# Patient Record
Sex: Female | Born: 1965 | Race: Black or African American | Hispanic: No | Marital: Single | Smoking: Never smoker
Health system: Southern US, Community
[De-identification: ages and names within clinical notes are randomized; demographics above are authoritative.]

## PROBLEM LIST (undated history)

## (undated) HISTORY — PX: CHOLECYSTECTOMY: SHX55

---

## 1998-07-11 ENCOUNTER — Other Ambulatory Visit: Admission: RE | Admit: 1998-07-11 | Discharge: 1998-07-11 | Payer: Self-pay | Admitting: Obstetrics and Gynecology

## 1998-11-16 ENCOUNTER — Inpatient Hospital Stay (HOSPITAL_COMMUNITY): Admission: AD | Admit: 1998-11-16 | Discharge: 1998-11-16 | Payer: Self-pay | Admitting: Obstetrics and Gynecology

## 1999-02-26 ENCOUNTER — Inpatient Hospital Stay (HOSPITAL_COMMUNITY): Admission: AD | Admit: 1999-02-26 | Discharge: 1999-02-28 | Payer: Self-pay | Admitting: Podiatrist

## 1999-03-29 ENCOUNTER — Other Ambulatory Visit: Admission: RE | Admit: 1999-03-29 | Discharge: 1999-03-29 | Payer: Self-pay | Admitting: Obstetrics and Gynecology

## 2003-05-28 ENCOUNTER — Emergency Department (HOSPITAL_COMMUNITY): Admission: EM | Admit: 2003-05-28 | Discharge: 2003-05-28 | Payer: Self-pay | Admitting: Emergency Medicine

## 2006-12-23 ENCOUNTER — Emergency Department (HOSPITAL_COMMUNITY): Admission: EM | Admit: 2006-12-23 | Discharge: 2006-12-23 | Payer: Self-pay | Admitting: Emergency Medicine

## 2007-01-22 ENCOUNTER — Ambulatory Visit (HOSPITAL_COMMUNITY): Admission: RE | Admit: 2007-01-22 | Discharge: 2007-01-23 | Payer: Self-pay | Admitting: General Surgery

## 2007-01-22 ENCOUNTER — Encounter (INDEPENDENT_AMBULATORY_CARE_PROVIDER_SITE_OTHER): Payer: Self-pay | Admitting: General Surgery

## 2008-05-12 IMAGING — US US ABDOMEN COMPLETE
1 series · 14 of 25 positions shown · non-contrast
Comparison: None.

CLINICAL DATA: Epigastric/chest pain with nausea and vomiting.
 ABDOMEN ULTRASOUND COMPLETE ? 12/23/06:
TECHNIQUE: Complete abdominal ultrasound examination was performed including evaluation of the liver, gallbladder, bile ducts, pancreas, kidneys, spleen, IVC, and abdominal aorta.

[Series 1: unknown · 0.33mm/px · 14 of 64 slices shown]
[im 1/64]
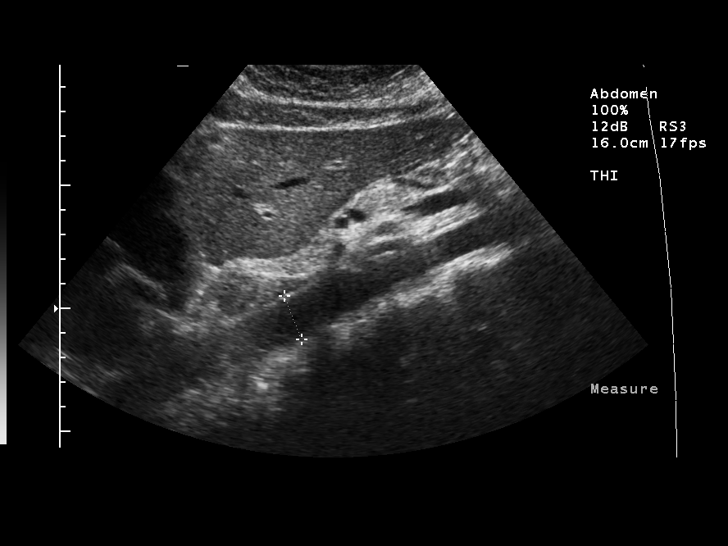
[im 6/64]
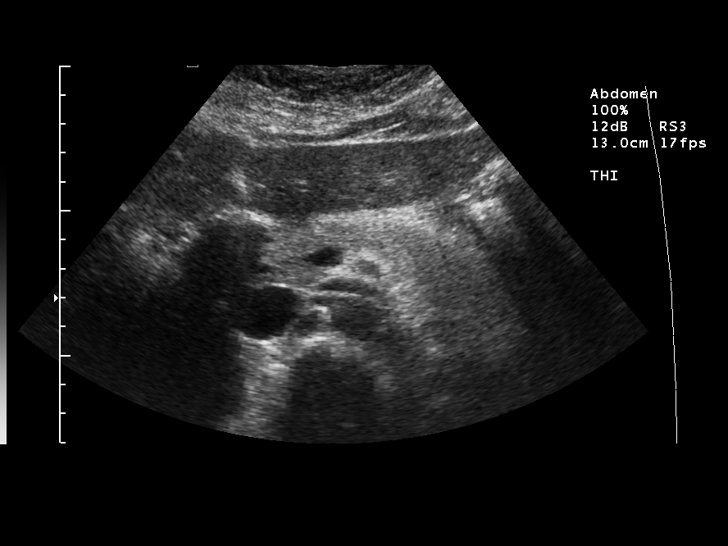
[im 11/64]
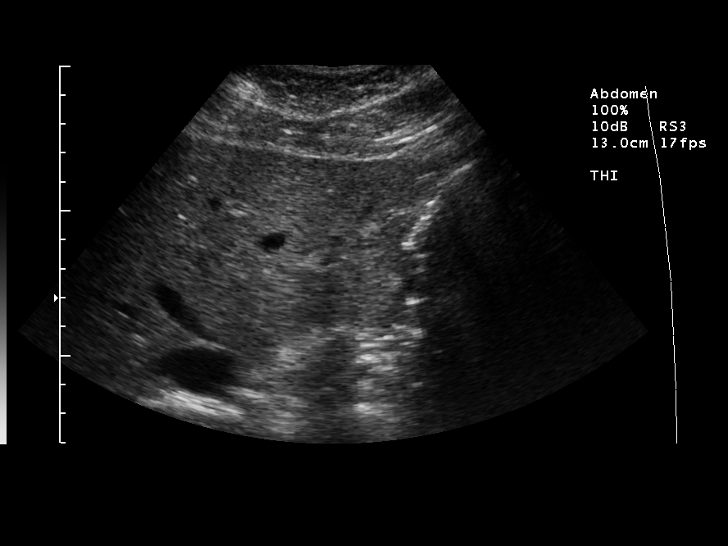
[im 16/64]
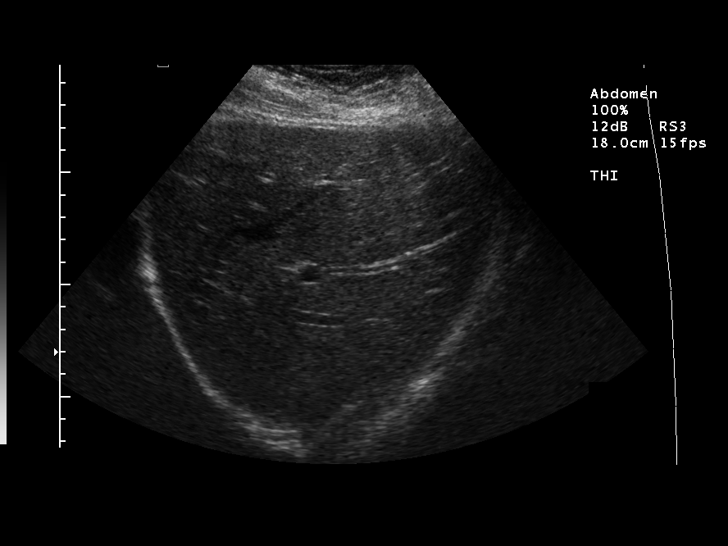
[im 22/64]
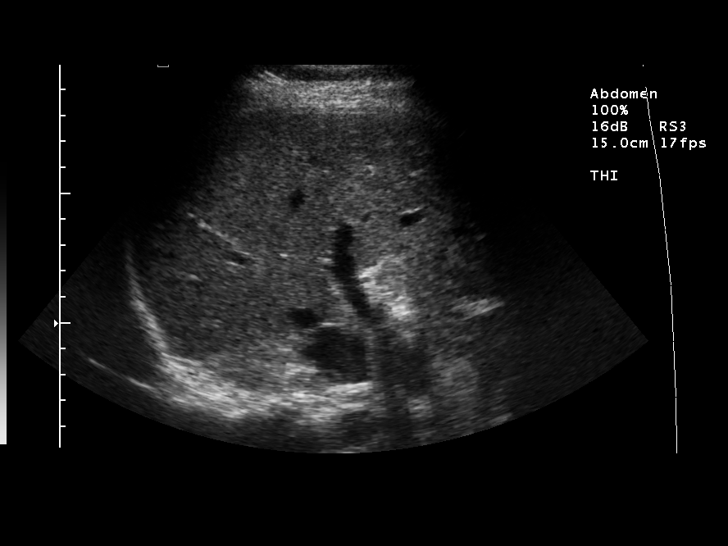
[im 24/64]
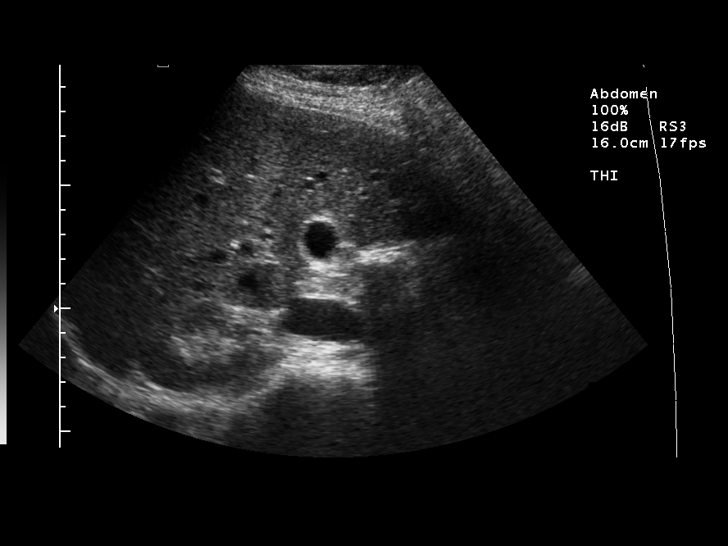
[im 29/64]
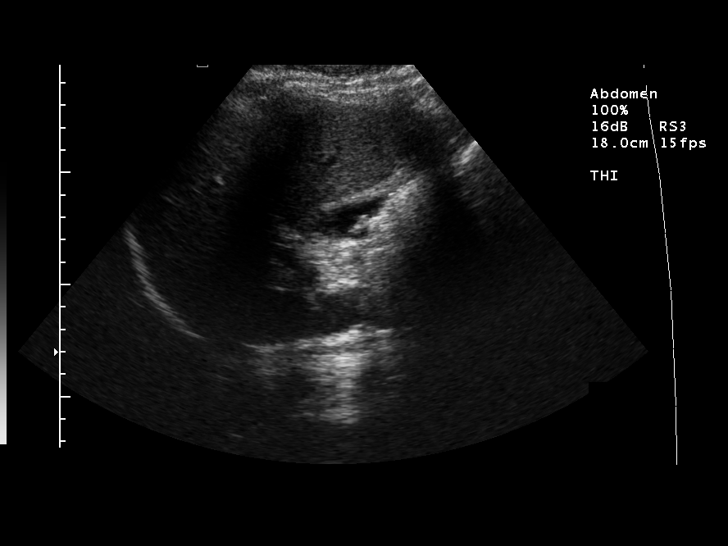
[im 35/64]
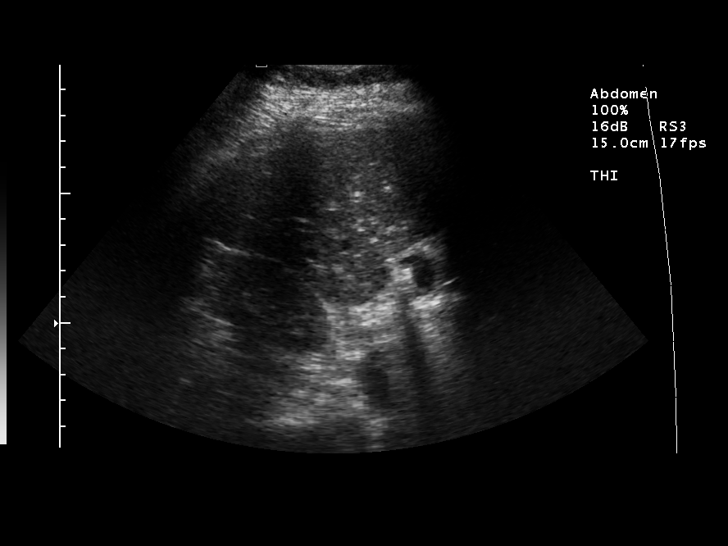
[im 40/64]
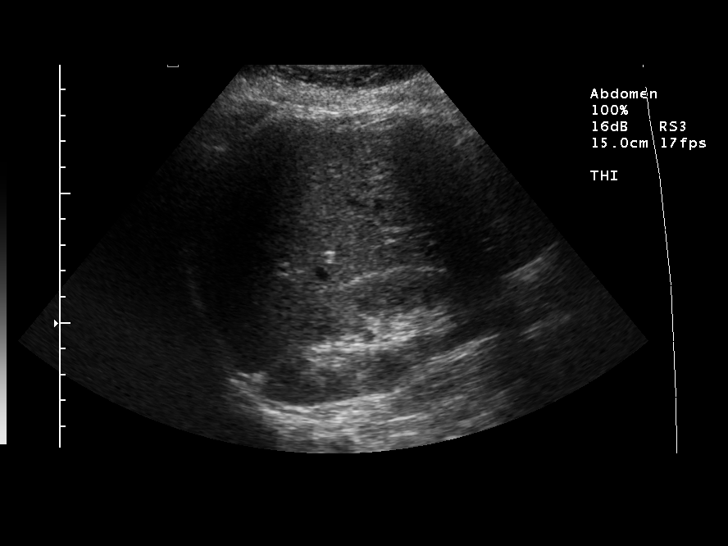
[im 43/64]
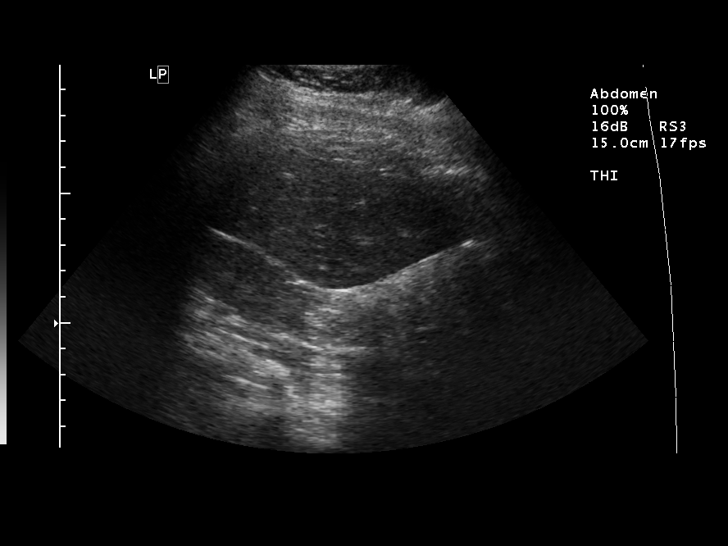
[im 48/64]
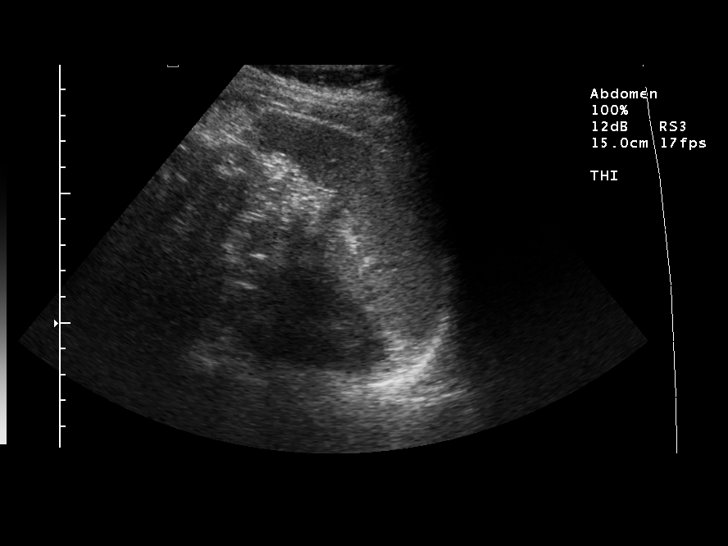
[im 53/64]
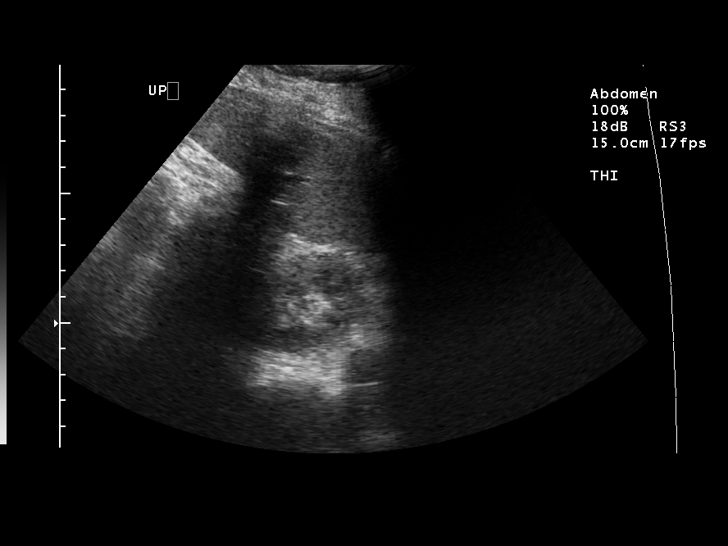
[im 58/64]
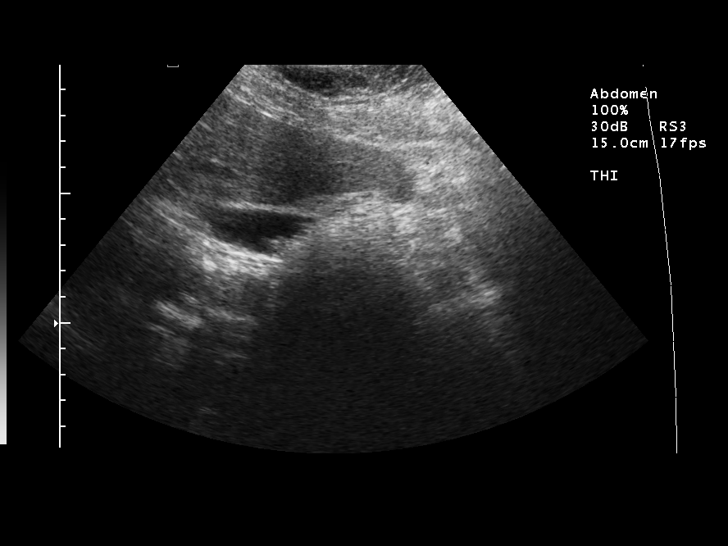
[im 64/64]
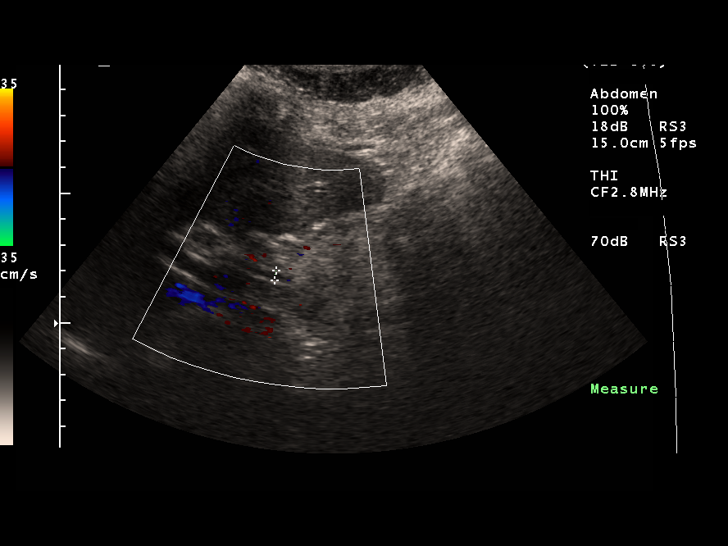

[14 of 25 positions shown; findings below may reference images not displayed]

FINDINGS: The gallbladder is relatively small.  It contains numerous shadowing stones.  The wall is borderline thickened measuring about 3 mm.  The patient is subjectively tender over the gallbladder.  There is no obvious wall edema or pericholecystic fluid.
 The intra- and extrahepatic biliary tree is normal. The common duct is 3.8 mm.  The liver, spleen, pancreas, and kidneys are normal. The aorta and IVC are normal.  No ascites.
IMPRESSION: Cholelithiasis with possible acute cholecystitis.  See report.

## 2011-01-08 NOTE — Op Note (Signed)
NAMESIYONA, COTO                ACCOUNT NO.:  [id number]   MEDICAL RECORD NO.:  [id number]          PATIENT TYPE:  OIB   LOCATION:  5709                         FACILITY:  [facility]   PHYSICIAN:  Ollen Gross. Vernell Morgans, M.D. DATE OF BIRTH:  May 29, 1966   DATE OF PROCEDURE:  01/22/2007  DATE OF DISCHARGE:  01/23/2007                               OPERATIVE REPORT   PREOPERATIVE DIAGNOSIS:  Cholecystitis.   POSTOPERATIVE DIAGNOSIS:  Cholecystitis.   PROCEDURE:  Laparoscopic cholecystectomy.   SURGEON:  Ollen Gross. Vernell Morgans, M.D.   ASSISTANT:  Angelia Mould. Derrell Lolling, M.D.   ANESTHESIA:  General endotracheal.   PROCEDURE:  After informed consent was obtained, the patient was brought  to the operating room and placed in a supine position on the operating  room table.  After adequate induction of general anesthesia, the  patient's abdomen was prepped with Betadine and draped in the usual  sterile manner.  The area above the umbilicus was infiltrated with 0.25%  Marcaine.  A small incision was made with a 15 blade knife.  This  incision was carried down through the subcutaneous tissue bluntly with a  hemostat and [employer] retractors until the linea alba was identified.  The linea alba was incised with a 15 blade knife and each side was  grasped with Kocher clamps and elevated anteriorly.  The preperitoneal  space was probed bluntly with a hemostat until the peritoneum was opened  and access was gained to the abdominal cavity and a 0 Vicryl pursestring  stitch was placed in the fascia around the opening.  A Hasson cannula  was placed through the opening and anchored in place with the previously-  laced Vicryl pursestring stitch.  The abdomen was then insufflated with  carbon dioxide without difficulty.  The laparoscope was inserted through  the Hasson cannula and the right upper quadrant was inspected.  The dome  of the gallbladder and liver were readily identified.  Next the  epigastric region was  infiltrated 0.25% Marcaine and a small incision  was made with a 15 blade knife.  A 10 mm port was placed bluntly through  this incision into the abdominal cavity under direct vision.  The  patient was placed in a head-up position and rotated slightly with the  right side up.  Next sites on the right side of the abdomen were chosen  for placement of 5 mm ports.  Each of these areas was infiltrated 0.25%  Marcaine.  Small stab incisions were made with a 15 blade knife and 5 mm  ports were placed bluntly through these incisions into the abdominal  cavity under direct vision.  A blunt grasper was placed through the  lateral-most 5 mm port and used to grasp the dome of gallbladder and  elevated anteriorly and superiorly.  The dissector was used to bluntly  take down some adhesions to the body of the gallbladder.  The  gallbladder was very thick-walled and chronically inflamed and woody.  We were unable to really dissect an identifiable plane near the  gallbladder neck.  We therefore decided to take  the gallbladder off the  liver using a top-down technique.  This was done with the hook  electrocautery.  Once we were able to do this, we were able to identify  the narrowed area of the gallbladder neck-cystic duct junction.  An  attempt was made to pass a cholangiogram catheter into this area but we  were unsuccessful.  We could tell from the anatomy, though, that we were  at the gallbladder neck-cystic duct junction.  We therefore placed a 60-  b laparoscopic stapler through the epigastric port and across the base  of the gallbladder at its junction with the cystic duct and clamped and  fired the stapler, dividing the gallbladder and the cystic duct between  staple lines.  A laparoscopic bag was inserted through the epigastric  port and the gallbladder was placed within the bag and the bag was  sealed.  The patient had normal liver functions preoperatively.  The  abdomen was then irrigated with  copious amounts of saline.  Next a 34-  [place] round Blake drain was brought through the epigastric port and out  the lateral-most 5-mm port.  The drain was placed in the gallbladder  bed.  The liver bed was inspected was found to be hemostatic.  The drain  was anchored to the skin with a 3-0 nylon stitch.  The rest of the ports  were then removed under direct vision and were all found to be  hemostatic.  The gas was allowed to escape.  The fascia of the  supraumbilical port was closed with the previously-placed Vicryl  pursestring stitch as well as with a couple other interrupted 0 Vicryl  stitches.  The skin incisions were all closed with interrupted 4-0  Monocryl subcuticular stitches.  Benzoin, Steri-Strips and sterile  dressings were applied.  The patient tolerated the procedure well.  At  the end of the case all needle, sponge and instrument counts were  correct.  The patient's drain was placed to bulb suction and the patient  was awakened and taken to the recovery room in stable condition.      Ollen Gross. Vernell Morgans, M.D.  Electronically Signed     PST/MEDQ  D:  01/23/2007  T:  01/23/2007  Job:  [id number]

## 2012-06-03 ENCOUNTER — Emergency Department (HOSPITAL_COMMUNITY): Payer: Self-pay

## 2012-06-03 ENCOUNTER — Emergency Department (HOSPITAL_COMMUNITY)
Admission: EM | Admit: 2012-06-03 | Discharge: 2012-06-03 | Disposition: A | Discharge status: Home / Self Care | Payer: Self-pay | Attending: Emergency Medicine | Admitting: Emergency Medicine

## 2012-06-03 ENCOUNTER — Encounter (HOSPITAL_COMMUNITY): Payer: Self-pay

## 2012-06-03 DIAGNOSIS — M542 Cervicalgia: Secondary | ICD-10-CM | POA: Insufficient documentation

## 2012-06-03 DIAGNOSIS — R51 Headache: Secondary | ICD-10-CM | POA: Insufficient documentation

## 2012-06-03 DIAGNOSIS — M62838 Other muscle spasm: Secondary | ICD-10-CM | POA: Insufficient documentation

## 2012-06-03 MED ORDER — IBUPROFEN 600 MG PO TABS: 600.0000 mg | ORAL_TABLET | Freq: Four times a day (QID) | ORAL | Status: AC | PRN

## 2012-06-03 MED ORDER — METHOCARBAMOL 100 MG/ML IJ SOLN
1000.0000 mg | Freq: Once | INTRAMUSCULAR | Status: DC
  Filled 2012-06-03: qty 10

## 2012-06-03 MED ORDER — METHOCARBAMOL 100 MG/ML IJ SOLN
1000.0000 mg | Freq: Once | INTRAVENOUS | Status: AC
  Administered 2012-06-03: 1000 mg via INTRAVENOUS
  Filled 2012-06-03: qty 10

## 2012-06-03 MED ORDER — METHOCARBAMOL 500 MG PO TABS: 500.0000 mg | ORAL_TABLET | Freq: Two times a day (BID) | ORAL | Status: DC

## 2012-06-03 MED ORDER — HYDROCODONE-ACETAMINOPHEN 5-500 MG PO TABS: 1.0000 | ORAL_TABLET | Freq: Four times a day (QID) | ORAL | Status: DC | PRN

## 2012-06-03 MED ORDER — HYDROCODONE-ACETAMINOPHEN 5-325 MG PO TABS
2.0000 | ORAL_TABLET | Freq: Once | ORAL | Status: AC
  Administered 2012-06-03: 2 via ORAL
  Filled 2012-06-03: qty 2

## 2012-06-03 MED ORDER — DIAZEPAM 5 MG PO TABS
5.0000 mg | ORAL_TABLET | Freq: Once | ORAL | Status: AC
  Administered 2012-06-03: 5 mg via ORAL
  Filled 2012-06-03: qty 1

## 2012-06-03 NOTE — ED Provider Notes (Signed)
   Dr. Rhunette Croft asked me to discuss with patient wether she would like an Lumbar Puncture to evaluate further her neck pain. The patient tells me this, " I thought about it, and I would rather get some pain medications, go home and see if I feel better. If I dont, I will come back and get the LP. That is what I would like to do".  I have made Dr. Rhunette Croft aware of this and he will dispo patient.  Dorthula Matas, PA 06/03/12 1208

## 2012-06-03 NOTE — ED Notes (Signed)
Family at bedside. 

## 2012-06-03 NOTE — ED Notes (Signed)
Pt woke up this am and has pain in right side of neck going down in to back worse with movement

## 2012-06-03 NOTE — ED Provider Notes (Signed)
Medical screening examination/treatment/procedure(s) were conducted as a shared visit with non-physician practitioner(s) and myself.  I personally evaluated the patient during the encounter  Derwood Kaplan, MD 06/03/12 1918

## 2012-06-03 NOTE — ED Provider Notes (Signed)
History     CSN: [id number]  Arrival date & time 06/03/12  0808   First MD Initiated Contact with Patient 06/03/12 [phone]      Chief Complaint  Patient presents with  . Neck Pain    (Consider location/radiation/quality/duration/timing/severity/associated sxs/prior treatment) HPI Comments: Pt with no medical hx comes in with cc of headache, neck pain. Pt states that her headache started early morning around 7:30 am, soon after she woke up. The pain is located in the posterior part of her head, and is shooting down her neck close to the level of the shoulder blade. She has no associated nausea, vomiting, visual complains, seizures, altered mental status, loss of consciousness, new weakness, or numbness, no gait instability. She denies any trauma, fall, hx of pain like this before. She did get dizzy at one point, primarily due to pain. Pt has no family hx of brain aneurysms, bleed.  The pain has gradually worsened, and is now rated at 8/10, and it shoots up to 10/10 with any movement. No hx of torticollis. Doesn't think she slept funny. No fevers, chills.  Patient is a 46 y.o. female presenting with neck pain. The history is provided by the patient.  Neck Pain  Associated symptoms include headaches. Pertinent negatives include no photophobia and no numbness.    No past medical history on file.  Past Surgical History  Procedure Date  . Cholecystectomy     No family history on file.  History  Substance Use Topics  . Smoking status: Never Smoker   . Smokeless tobacco: Not on file  . Alcohol Use: No    OB History    Grav Para Term Preterm Abortions TAB SAB Ect Mult Living                  Review of Systems  HENT: Positive for neck pain. Negative for hearing loss.   Eyes: Negative for photophobia, pain, discharge, itching and visual disturbance.  Neurological: Positive for headaches. Negative for dizziness, seizures, speech difficulty, light-headedness and numbness.     Allergies  Review of patient's allergies indicates no known allergies.  Home Medications   Current Outpatient Rx  Name Route Sig Dispense Refill  . ACETAMINOPHEN 325 MG PO TABS Oral Take 325 mg by mouth every 6 (six) hours as needed. Pain/headache    . HYDROCODONE-ACETAMINOPHEN 5-500 MG PO TABS Oral Take 1-2 tablets by mouth every 6 (six) hours as needed for pain. 15 tablet 0  . IBUPROFEN 600 MG PO TABS Oral Take 1 tablet (600 mg total) by mouth every 6 (six) hours as needed for pain. 30 tablet 0  . METHOCARBAMOL 500 MG PO TABS Oral Take 1 tablet (500 mg total) by mouth 2 (two) times daily. 20 tablet 0    BP 139/66  Pulse 57  Temp 98.6 F (37 C) (Oral)  Resp 16  SpO2 100%  LMP 05/20/2012  Physical Exam  Constitutional: She is oriented to person, place, and time. She appears well-developed.  HENT:  Head: Normocephalic and atraumatic.       No nystagmus  Eyes: Conjunctivae normal and EOM are normal. Pupils are equal, round, and reactive to light.  Neck: Normal range of motion. Neck supple.  Cardiovascular: Normal rate, regular rhythm and normal heart sounds.   Pulmonary/Chest: Effort normal and breath sounds normal. No respiratory distress.  Abdominal: Soft. Bowel sounds are normal. She exhibits no distension. There is no tenderness. There is no rebound and no guarding.  Neurological: She  is alert and oriented to person, place, and time. No cranial nerve deficit. Coordination normal.  Skin: Skin is warm and dry.    ED Course  Procedures (including critical care time)  Labs Reviewed - No data to display Ct Head Wo Contrast  06/03/2012  *RADIOLOGY REPORT*  Clinical Data: Back of head pain and neck stiffness.  CT HEAD WITHOUT CONTRAST  Technique:  Contiguous axial images were obtained from the base of the skull through the vertex without contrast.  Comparison: None.  Findings: No evidence of acute infarct, acute hemorrhage, mass lesion, mass effect or hydrocephalus.   Visualized portions of the paranasal sinuses are clear.  Frontal sinuses are hypoplastic. Mastoid air cells are clear.  IMPRESSION: Negative examination.   Original Report Authenticated By: Reyes Ivan, M.D.      1. Neck muscle spasm       MDM  DDX includes: Primary headaches - including migrainous headaches, cluster headaches, tension headaches. ICH Carotid dissection Cavernous sinus thrombosis Meningitis Encephalitis Sinusitis Tumor Vascular headaches AV malformation Brain aneurysm Muscular headaches Torticollis   A/P: Pt comes in with cc of headaches, neck pain. The headaches are new, and severe. They are not thunder clap in nature, and there is no neurologic symptoms associated with them. Pt has no risk factors for SAH, dissections.  Pt arrived within just a few hours of headache onset, and if CT head is negative, concern for [facility] will be lowered. That being said, the headache fits no classic primary headache syndrome, and i would have to get a LP to r/o SAH definitively. Patient is made aware of this plan, and is unsure about LP. There were no neck bruits, no spesms i can appreciate, and her eye exam is normal - so carotid dissection, though possible is also low on the ddx.  Plan is to get CT head, get some pain meds on boards and assess.  LATE ENTRY: Upon reassessment, patient has persistent pain. She still was unsure of LP, and i explained the rational behind the LP and the risks and benefits. I had our PA check with her after - and patient refused to get LP, she wanted to get some meds, and see how things go. We had already discussed some of the REDFLAGS with headaches during HPI, and i told her if she gets worse, she should return to the ER immediately.         Derwood Kaplan, MD 06/03/12 1311

## 2013-10-22 IMAGING — CT CT HEAD W/O CM
1 series · 16 of 30 positions shown, 20 images · non-contrast
Comparison: None.

CLINICAL DATA: Back of head pain and neck stiffness.

CT HEAD WITHOUT CONTRAST
TECHNIQUE: Contiguous axial images were obtained from the base of
the skull through the vertex without contrast.

[Series 2: (id) head 4.8 h37s st · axial · 0.61mm/px · z∈[-168,-5]mm · 16 of 36 slices shown, 20 images]
[im 2/36  brain]
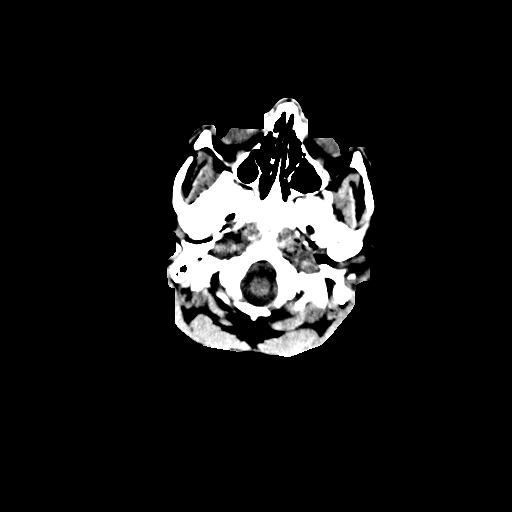
[im 2/36  bone]
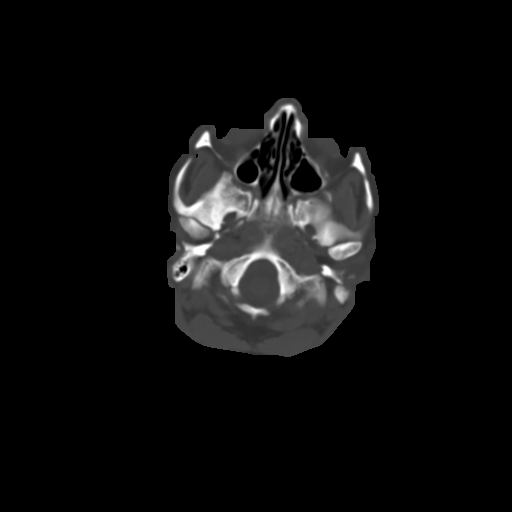
[im 4/36  brain]
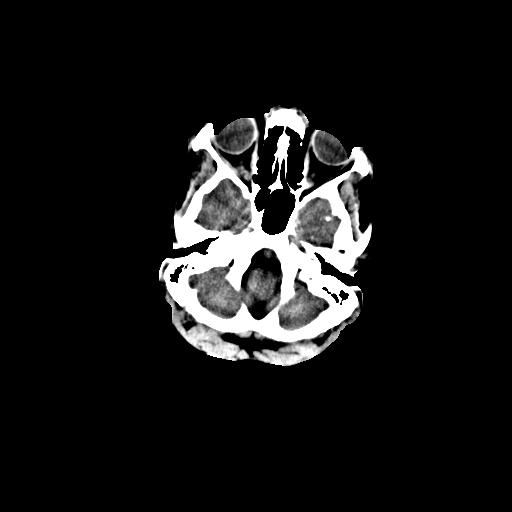
[im 7/36  brain]
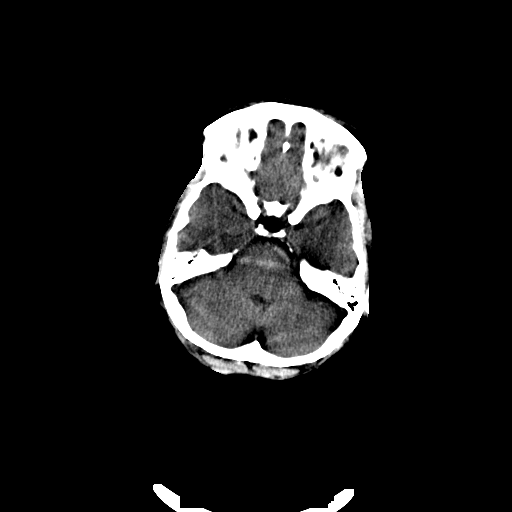
[im 9/36  brain]
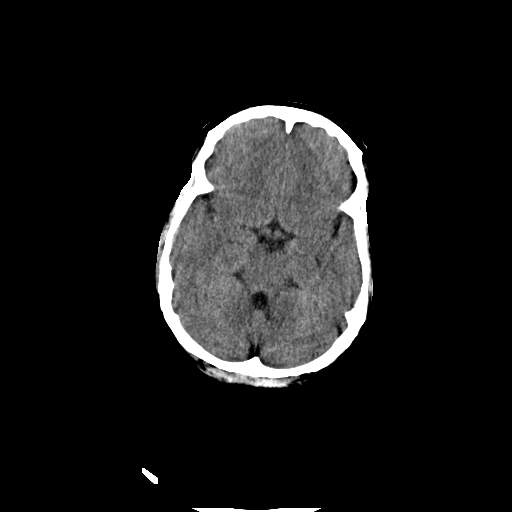
[im 10/36  brain]
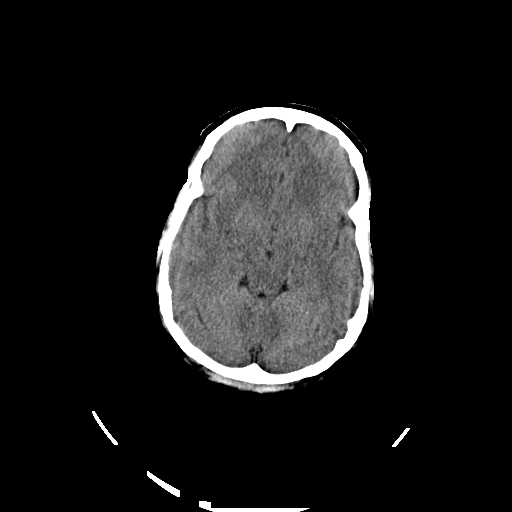
[im 10/36  bone]
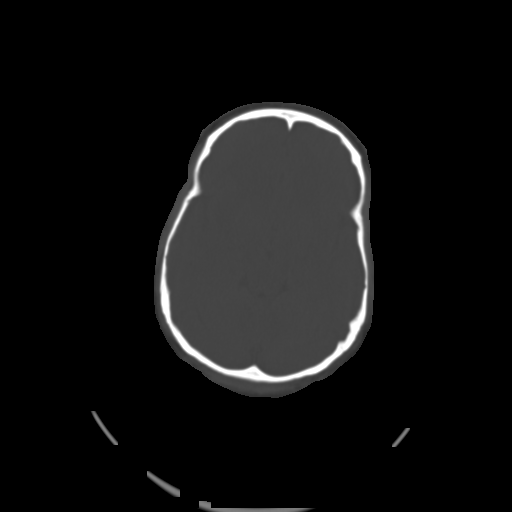
[im 13/36  brain]
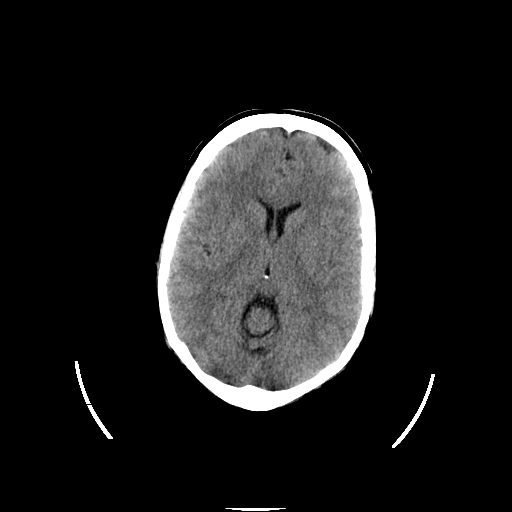
[im 15/36  brain]
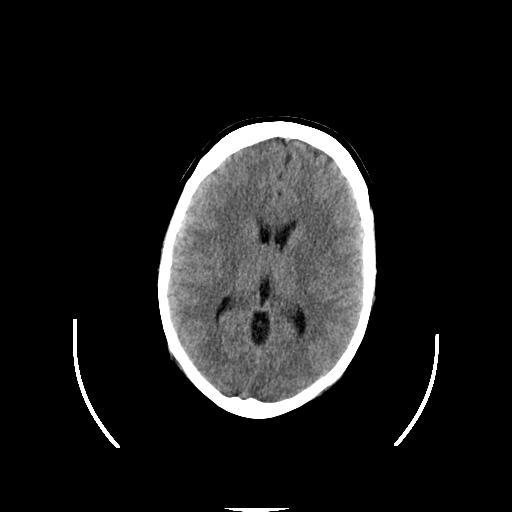
[im 17/36  brain]
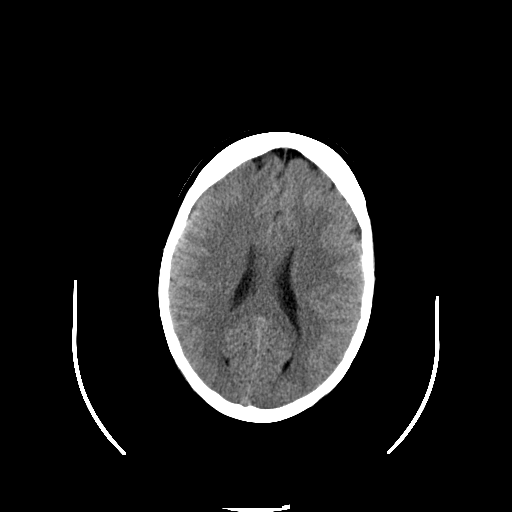
[im 19/36  brain]
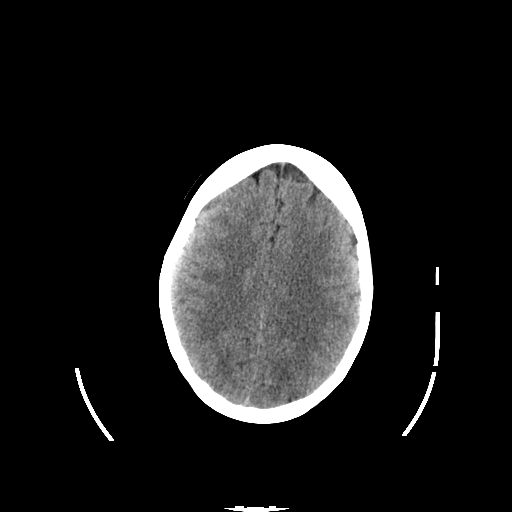
[im 19/36  bone]
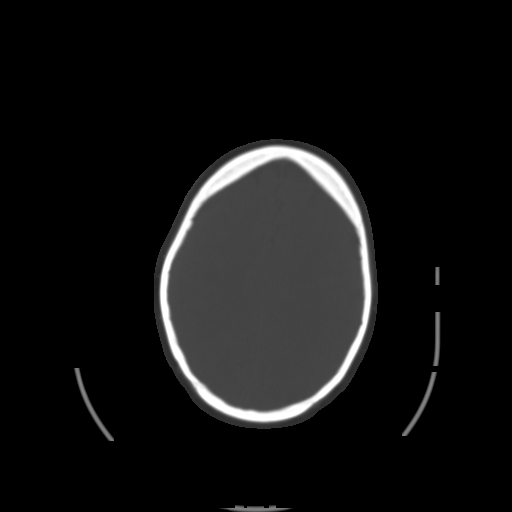
[im 21/36  brain]
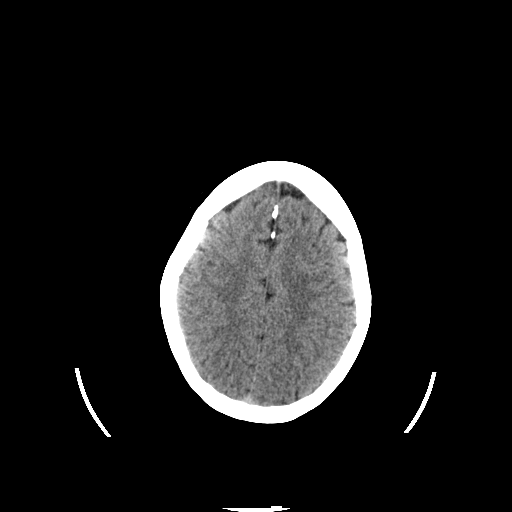
[im 23/36  brain]
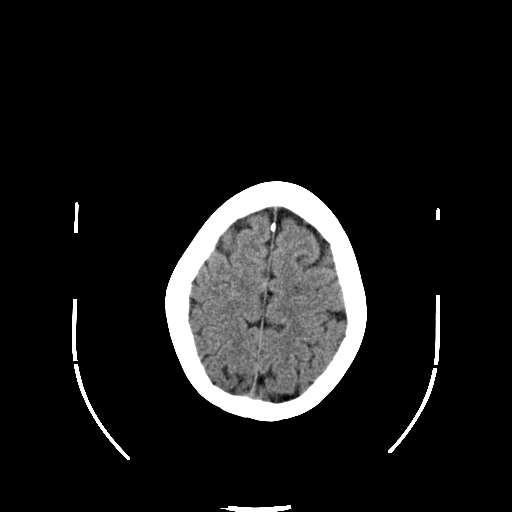
[im 26/36  brain]
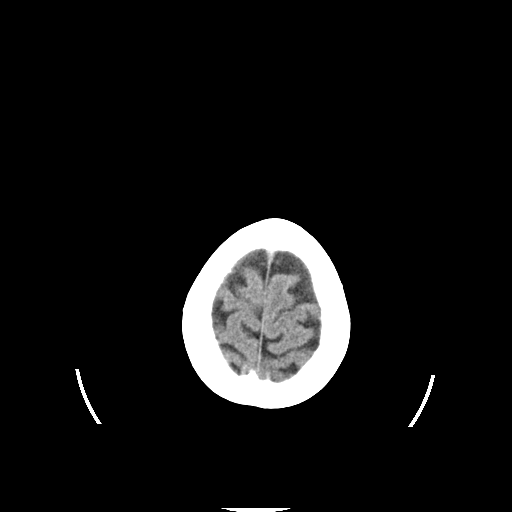
[im 27/36  brain]
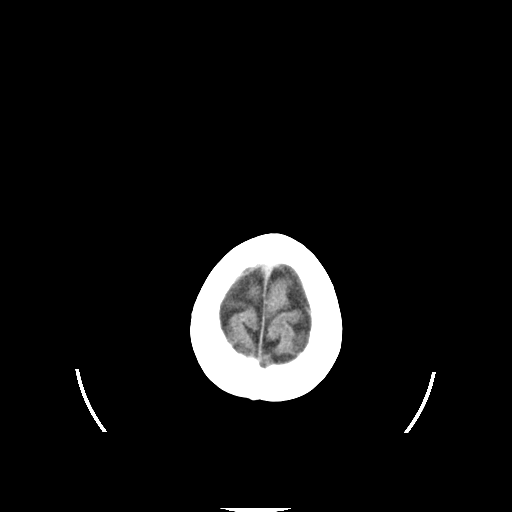
[im 27/36  bone]
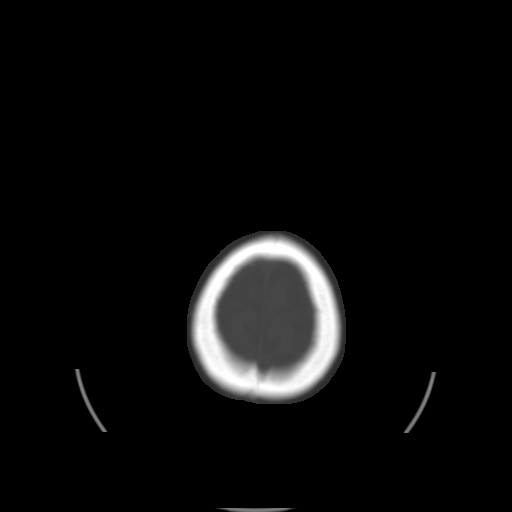
[im 29/36  brain]
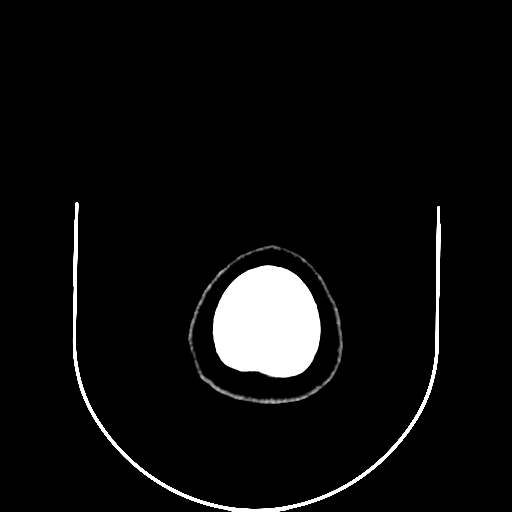
[im 32/36  brain]
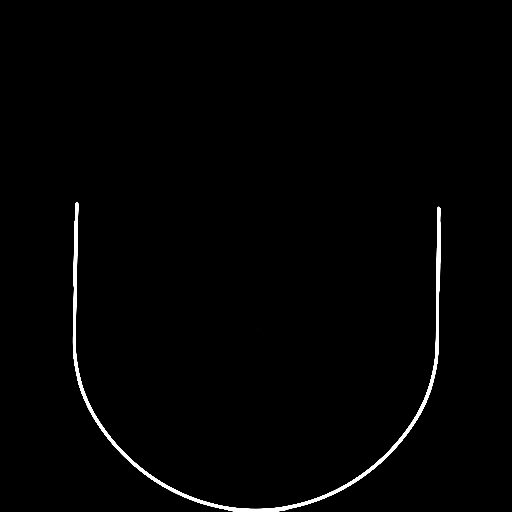
[im 34/36  brain]
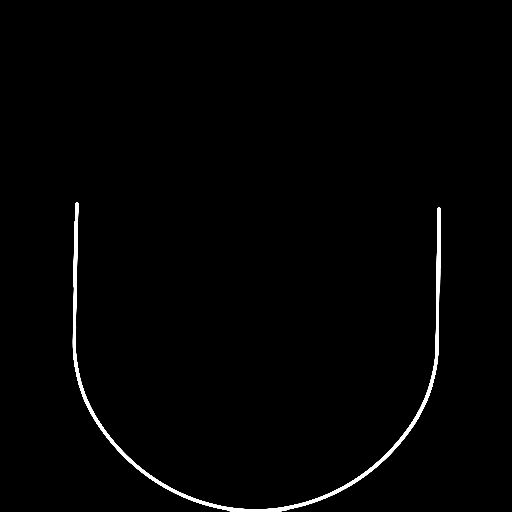

[16 of 30 positions shown; findings below may reference images not displayed]

FINDINGS: No evidence of acute infarct, acute hemorrhage, mass
lesion, mass effect or hydrocephalus.  Visualized portions of the
paranasal sinuses are clear.  Frontal sinuses are hypoplastic.
Mastoid air cells are clear.
IMPRESSION: Negative examination.

## 2015-02-16 ENCOUNTER — Ambulatory Visit (INDEPENDENT_AMBULATORY_CARE_PROVIDER_SITE_OTHER): Payer: Self-pay | Admitting: Family Medicine

## 2015-02-16 VITALS — BP 146/80 | HR 69 | Temp 98.4°F | Resp 16 | Ht 64.5 in | Wt 167.4 lb

## 2015-02-16 DIAGNOSIS — L255 Unspecified contact dermatitis due to plants, except food: Secondary | ICD-10-CM

## 2015-02-16 MED ORDER — TRIAMCINOLONE ACETONIDE 0.1 % EX CREA: 1.0000 "application " | TOPICAL_CREAM | Freq: Two times a day (BID) | CUTANEOUS | Status: AC

## 2015-02-16 NOTE — Patient Instructions (Signed)
Poison Ivy  Poison ivy is a inflammation of the skin (contact dermatitis) caused by touching the allergens on the leaves of the ivy plant following previous exposure to the plant. The rash usually appears 48 hours after exposure. The rash is usually bumps (papules) or blisters (vesicles) in a linear pattern. Depending on your own sensitivity, the rash may simply cause redness and itching, or it may also progress to blisters which may break open. These must be well cared for to prevent secondary bacterial (germ) infection, followed by scarring. Keep any open areas dry, clean, dressed, and covered with an antibacterial ointment if needed. The eyes may also get puffy. The puffiness is worst in the morning and gets better as the day progresses. This dermatitis usually heals without scarring, within 2 to 3 weeks without treatment.  HOME CARE INSTRUCTIONS   Thoroughly wash with soap and water as soon as you have been exposed to poison ivy. You have about one half hour to remove the plant resin before it will cause the rash. This washing will destroy the oil or antigen on the skin that is causing, or will cause, the rash. Be sure to wash under your fingernails as any plant resin there will continue to spread the rash. Do not rub skin vigorously when washing affected area. Poison ivy cannot spread if no oil from the plant remains on your body. A rash that has progressed to weeping sores will not spread the rash unless you have not washed thoroughly. It is also important to wash any clothes you have been wearing as these may carry active allergens. The rash will return if you wear the unwashed clothing, even several days later.  Avoidance of the plant in the future is the best measure. Poison ivy plant can be recognized by the number of leaves. Generally, poison ivy has three leaves with flowering branches on a single stem.  Diphenhydramine may be purchased over the counter and used as needed for itching. Do not drive with  this medication if it makes you drowsy.Ask your caregiver about medication for children.  SEEK MEDICAL CARE IF:  · Open sores develop.  · Redness spreads beyond area of rash.  · You notice purulent (pus-like) discharge.  · You have increased pain.  · Other signs of infection develop (such as fever).  Document Released: 08/09/2000 Document Revised: 11/04/2011 Document Reviewed: 01/20/2009  ExitCare® Patient Information ©2015 [employer]. This information is not intended to replace advice given to you by your health care provider. Make sure you discuss any questions you have with your health care provider.    Contact Dermatitis  Contact dermatitis is a reaction to certain substances that touch the skin. Contact dermatitis can be either irritant contact dermatitis or allergic contact dermatitis. Irritant contact dermatitis does not require previous exposure to the substance for a reaction to occur. Allergic contact dermatitis only occurs if you have been exposed to the substance before. Upon a repeat exposure, your body reacts to the substance.   CAUSES   Many substances can cause contact dermatitis. Irritant dermatitis is most commonly caused by repeated exposure to mildly irritating substances, such as:  · Makeup.  · Soaps.  · Detergents.  · Bleaches.  · Acids.  · Metal salts, such as nickel.  Allergic contact dermatitis is most commonly caused by exposure to:  · Poisonous plants.  · Chemicals (deodorants, shampoos).  · Jewelry.  · Latex.  · Neomycin in triple antibiotic cream.  · Preservatives in products, including   clothing.  SYMPTOMS   The area of skin that is exposed may develop:  · Dryness or flaking.  · Redness.  · Cracks.  · Itching.  · Pain or a burning sensation.  · Blisters.  With allergic contact dermatitis, there may also be swelling in areas such as the eyelids, mouth, or genitals.   DIAGNOSIS   Your caregiver can usually tell what the problem is by doing a physical exam. In cases where the cause is  uncertain and an allergic contact dermatitis is suspected, a patch skin test may be performed to help determine the cause of your dermatitis.  TREATMENT  Treatment includes protecting the skin from further contact with the irritating substance by avoiding that substance if possible. Barrier creams, powders, and gloves may be helpful. Your caregiver may also recommend:  · Steroid creams or ointments applied 2 times daily. For best results, soak the rash area in cool water for 20 minutes. Then apply the medicine. Cover the area with a plastic wrap. You can store the steroid cream in the refrigerator for a "chilly" effect on your rash. That may decrease itching. Oral steroid medicines may be needed in more severe cases.  · Antibiotics or antibacterial ointments if a skin infection is present.  · Antihistamine lotion or an antihistamine taken by mouth to ease itching.  · Lubricants to keep moisture in your skin.  · Burow's solution to reduce redness and soreness or to dry a weeping rash. Mix one packet or tablet of solution in 2 cups cool water. Dip a clean washcloth in the mixture, wring it out a bit, and put it on the affected area. Leave the cloth in place for 30 minutes. Do this as often as possible throughout the day.  · Taking several cornstarch or baking soda baths daily if the area is too large to cover with a washcloth.  Harsh chemicals, such as alkalis or acids, can cause skin damage that is like a burn. You should flush your skin for 15 to 20 minutes with cold water after such an exposure. You should also seek immediate medical care after exposure. Bandages (dressings), antibiotics, and pain medicine may be needed for severely irritated skin.   HOME CARE INSTRUCTIONS  · Avoid the substance that caused your reaction.  · Keep the area of skin that is affected away from hot water, soap, sunlight, chemicals, acidic substances, or anything else that would irritate your skin.  · Do not scratch the rash. Scratching  may cause the rash to become infected.  · You may take cool baths to help stop the itching.  · Only take over-the-counter or prescription medicines as directed by your caregiver.  · See your caregiver for follow-up care as directed to make sure your skin is healing properly.  SEEK MEDICAL CARE IF:   · Your condition is not better after 3 days of treatment.  · You seem to be getting worse.  · You see signs of infection such as swelling, tenderness, redness, soreness, or warmth in the affected area.  · You have any problems related to your medicines.  Document Released: 08/09/2000 Document Revised: 11/04/2011 Document Reviewed: 01/15/2011  ExitCare® Patient Information ©2015 [employer]. This information is not intended to replace advice given to you by your health care provider. Make sure you discuss any questions you have with your health care provider.

## 2015-02-16 NOTE — Progress Notes (Signed)
   Subjective:  This chart was scribed for Norberto Sorenson, MD by Charline Bills, ED Scribe. The patient was seen in room 10. Patient's care was started at 9:47 AM.   Patient ID: Tonya Bennett, female    DOB: April 23, 1966, 49 y.o.   MRN: [id number]  Chief Complaint  Patient presents with  . Rash    both ankles, started as bumps, pa scratched rash spreaded, also behind neck     HPI HPI Comments: Tonya Bennett is a 49 y.o. female who presents to the [facility] complaining of gradual onset of pruritic rash to bilateral ankles first noted 3-4 days ago. Pt states that she scratched the rash which caused bleeding where she tore her skin. She has tried rubbing a lemon on the rash and caladryl clear with minimal relief. She also reports a non-pruritic rash to the back of her neck that she incidentally discovered. Pt works in [employer] at [employer]   History reviewed. No pertinent past medical history.  Current Outpatient Prescriptions on File Prior to Visit  Medication Sig Dispense Refill  . acetaminophen (TYLENOL) 325 MG tablet Take 325 mg by mouth every 6 (six) hours as needed. Pain/headache    . ibuprofen (ADVIL,MOTRIN) 600 MG tablet Take 1 tablet (600 mg total) by mouth every 6 (six) hours as needed for pain. 30 tablet 0   No current facility-administered medications on file prior to visit.   No Known Allergies  Review of Systems  Constitutional: Negative for fever.  Skin: Positive for color change, rash and wound.   BP 146/80 mmHg  Pulse 69  Temp(Src) 98.4 F (36.9 C) (Oral)  Resp 16  Ht 5' 4.5" (1.638 m)  Wt 167 lb 6.4 oz (75.932 kg)  BMI 28.30 kg/m2  SpO2 99%  LMP 12/25/2014    Objective:   Physical Exam  Constitutional: She is oriented to person, place, and time. She appears well-developed and well-nourished. No distress.  HENT:  Head: Normocephalic and atraumatic.  Eyes: Conjunctivae and EOM are normal.  Neck: Neck supple. No tracheal deviation present.    Cardiovascular: Normal rate.   Pulmonary/Chest: Effort normal. No respiratory distress.  Musculoskeletal: Normal range of motion.  Neurological: She is alert and oriented to person, place, and time.  Skin: Skin is warm and dry. Rash noted. Rash is papular.  Bilateral LE immediately above ankle and approximately 5 inch extending proximally are tiny vesicles on base which coalesce together with several areas of abrasion and hyperpigmentation over area laterally where lemon was applied. 1+ nonpitting edema to ankles but mid calves and feet are normal.  Neck: Very subtle, fine papular rash consistent with very mild folliculitis.   Psychiatric: She has a normal mood and affect. Her behavior is normal.  Nursing note and vitals reviewed.     Assessment & Plan:   1. Rhus dermatitis   Suspect poison ivy.  Meds ordered this encounter  Medications  . triamcinolone cream (KENALOG) 0.1 %    Sig: Apply 1 application topically 2 (two) times daily.    Dispense:  80 g    Refill:  2    I personally performed the services described in this documentation, which was scribed in my presence. The recorded information has been reviewed and considered, and addended by me as needed.  Norberto Sorenson, MD MPH
